# Patient Record
Sex: Male | Born: 1975 | Race: White | Hispanic: No | Smoking: Current every day smoker
Health system: Southern US, Academic
[De-identification: ages and names within clinical notes are randomized; demographics above are authoritative.]

## PROBLEM LIST (undated history)

## (undated) DIAGNOSIS — J45909 Unspecified asthma, uncomplicated: Secondary | ICD-10-CM

## (undated) DIAGNOSIS — E119 Type 2 diabetes mellitus without complications: Secondary | ICD-10-CM

## (undated) DIAGNOSIS — Z973 Presence of spectacles and contact lenses: Secondary | ICD-10-CM

## (undated) HISTORY — PX: HX HERNIA REPAIR: SHX51

## (undated) HISTORY — PX: KNEE ARTHROSCOPY: SUR90

## (undated) HISTORY — PX: VESICOSTOMY: SHX2661

## (undated) HISTORY — PX: HX EAR TUBES: 2100001170

## (undated) HISTORY — PX: HX ADENOIDECTOMY: SHX29

---

## 2016-03-05 ENCOUNTER — Other Ambulatory Visit (HOSPITAL_COMMUNITY): Payer: Self-pay | Admitting: Family

## 2021-02-17 IMAGING — MR MRI JOINT UPPER EXTREMITY WITHOUT CONTRAST LT
5 of 6 series · 29 of 40 positions shown · non-contrast
Comparison: None.

﻿EXAM:  MRI JOINT UPPER EXTREMITY WITHOUT CONTRAST LT SHOULDER
INDICATION: Left shoulder osteoarthritis.  Joint crepitus.   Evaluate labrum and rotator cuff integrity.
TECHNIQUE: Multiplanar, multisequence noncontrast MRI was performed.

[Series 6: T1 · oblique · left · 4.0mm · 0.31mm/px · 7 of 22 slices shown]
[im 1/22]
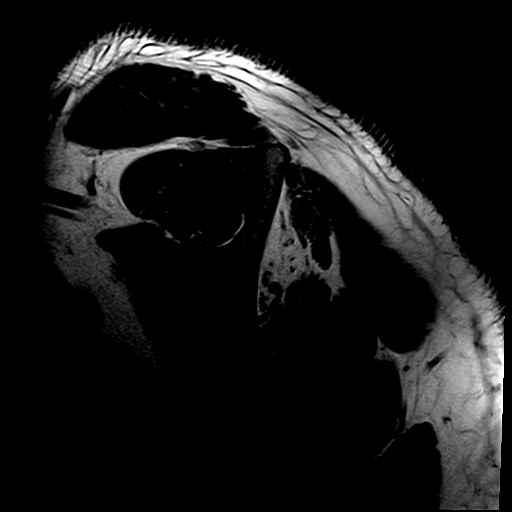
[im 4/22]
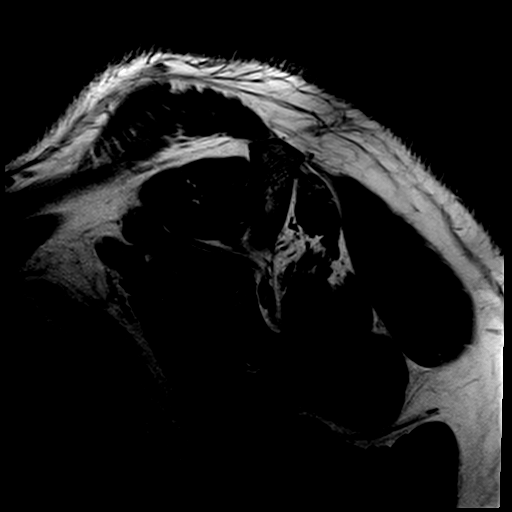
[im 8/22]
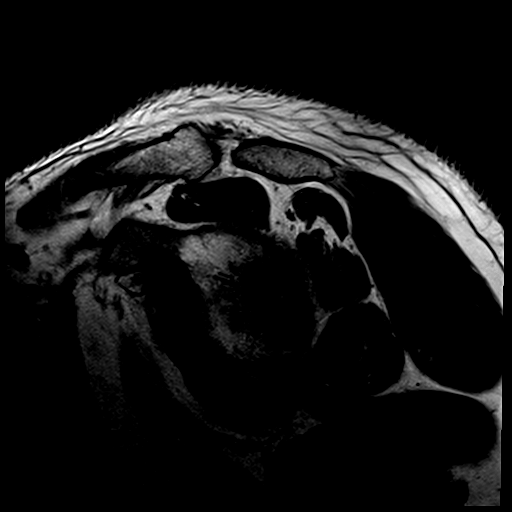
[im 11/22]
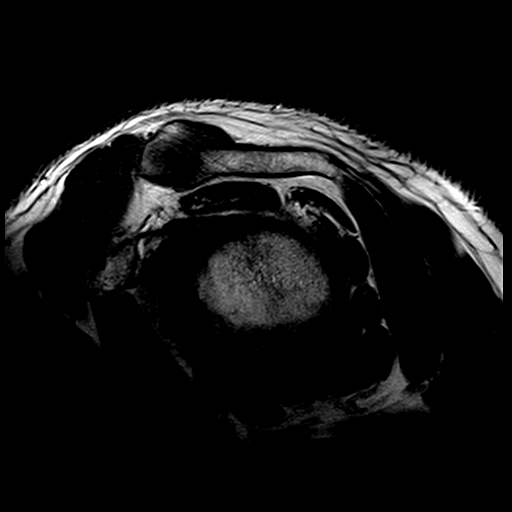
[im 15/22]
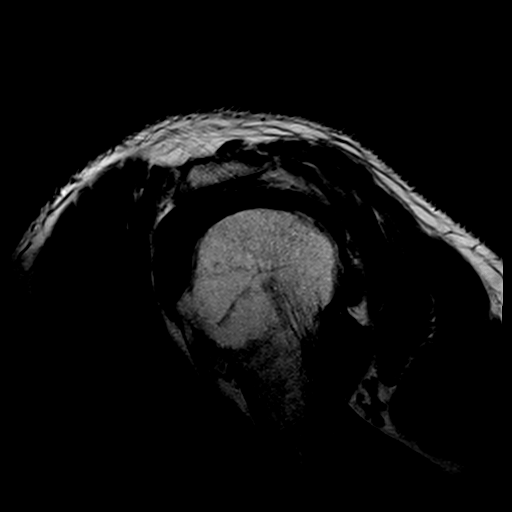
[im 18/22]
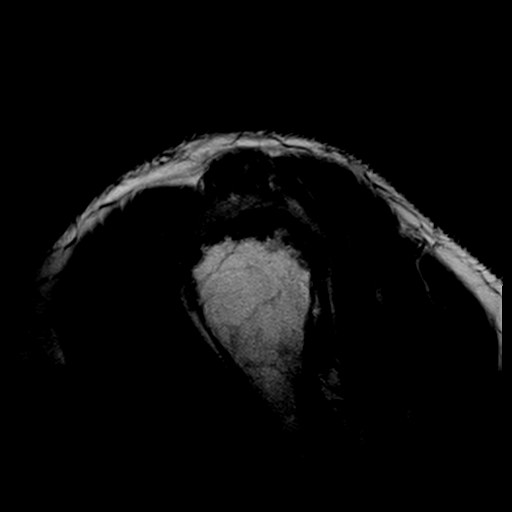
[im 22/22]
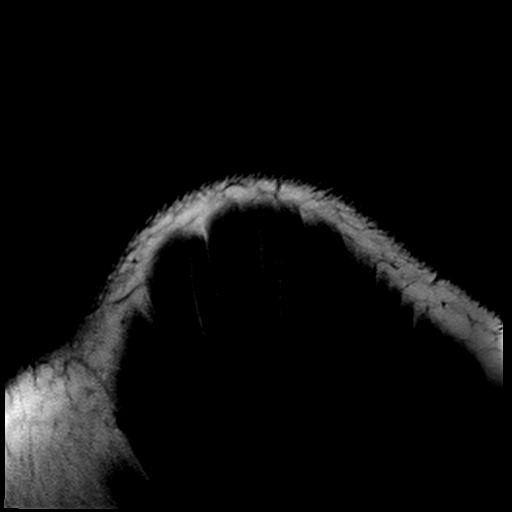

[Series 7: PD fat-sat · axial · left · 4.0mm · 0.36mm/px · z∈[-104,+6]mm · 7 of 24 slices shown (1 of 2)]
[im 1/24]
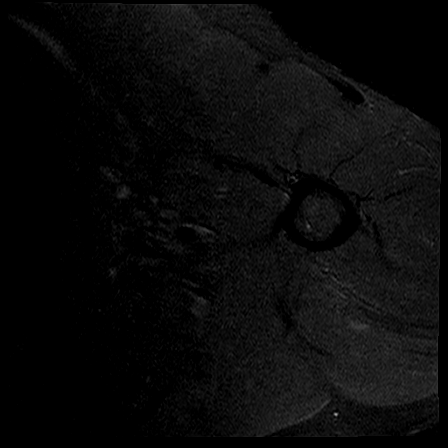
[im 4/24]
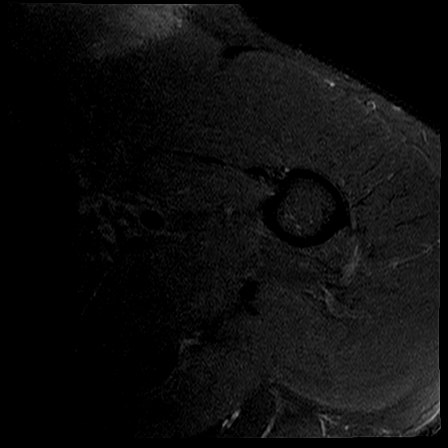
[im 8/24]
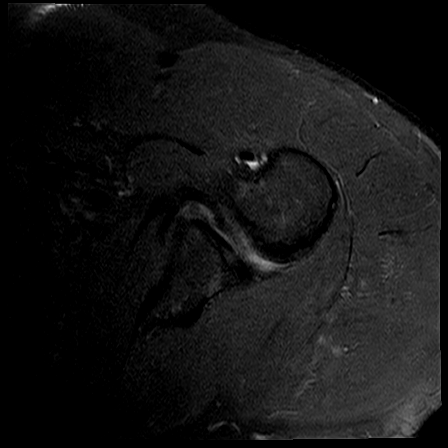
[im 12/24]
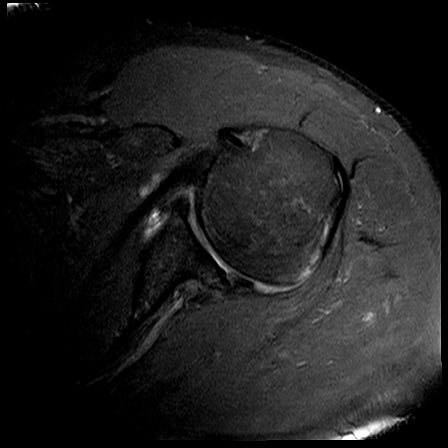
[im 16/24]
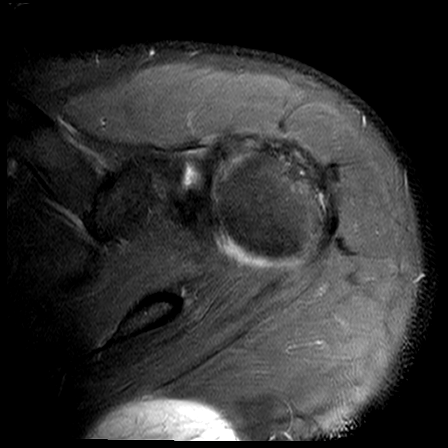
[im 20/24]
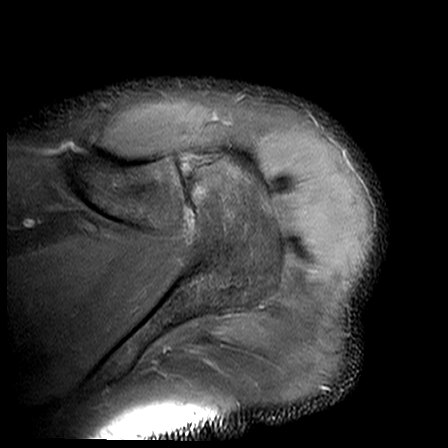
[im 24/24]
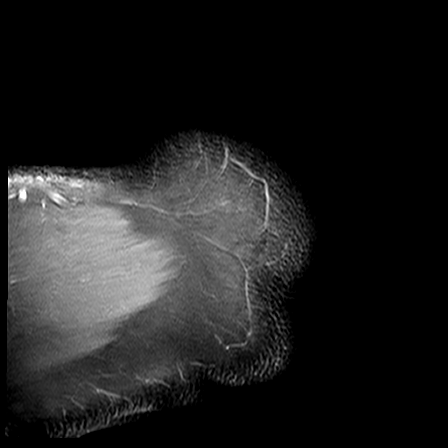

[Series 8: STIR · oblique · left · 4.0mm · 0.50mm/px · 2 of 22 slices shown]
[im 1/22]
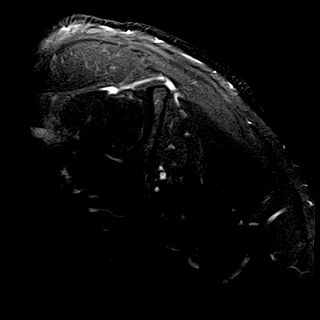
[im 4/22]
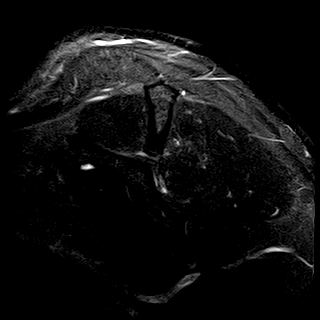

[Series 9: T2 fat-sat · axial · left · 4.0mm · 0.42mm/px · z∈[-104,+6]mm · 7 of 24 slices shown]
[im 1/24]
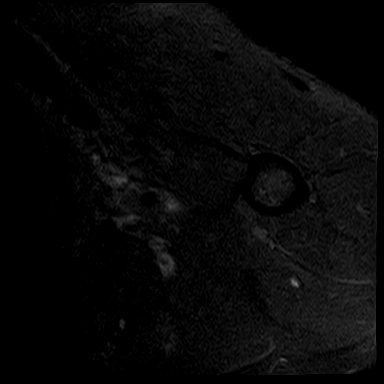
[im 4/24]
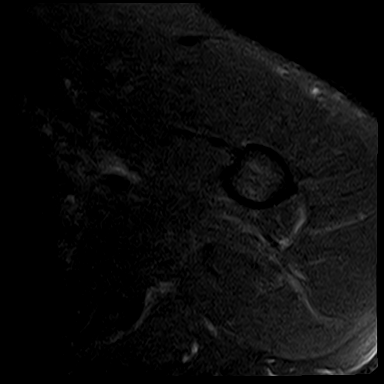
[im 8/24]
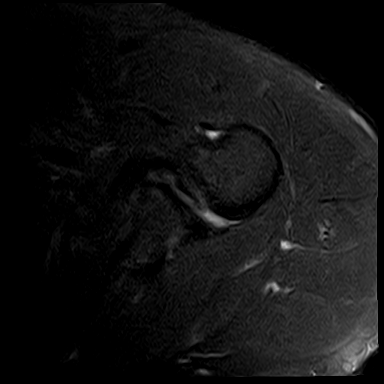
[im 12/24]
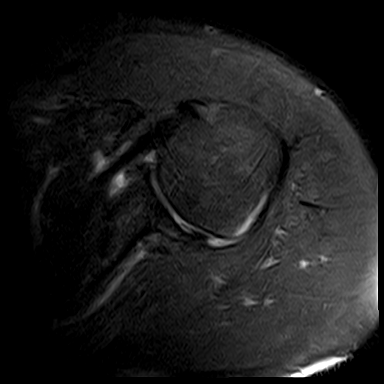
[im 16/24]
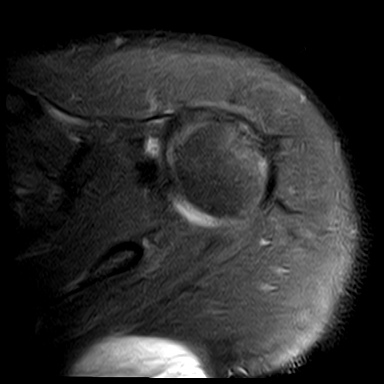
[im 20/24]
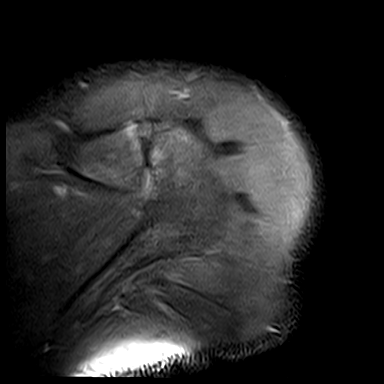
[im 24/24]
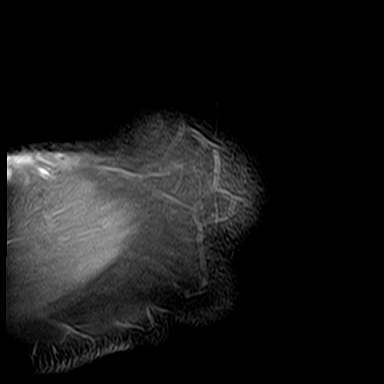

[Series 10: PD fat-sat · oblique · left · 4.0mm · 0.47mm/px · 6 of 21 slices shown (2 of 2)]
[im 1/21]
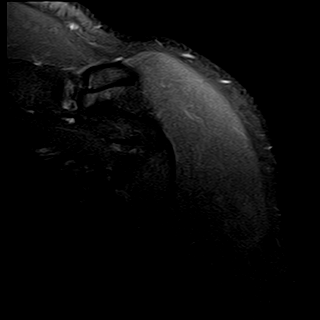
[im 5/21]
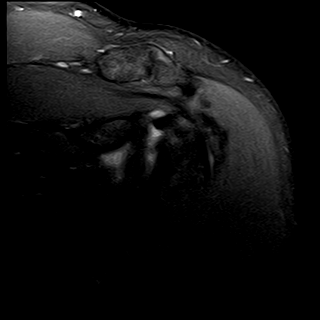
[im 9/21]
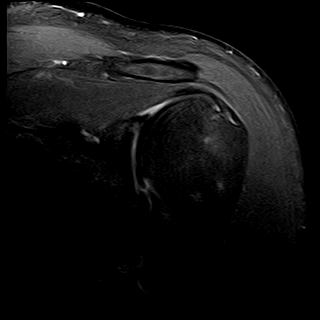
[im 13/21]
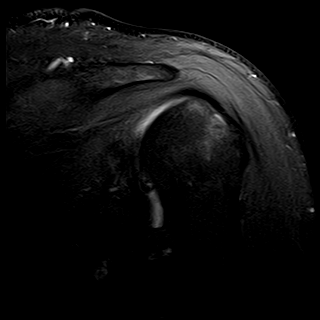
[im 17/21]
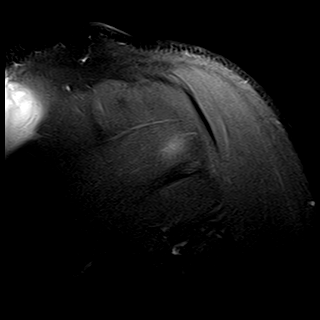
[im 21/21]
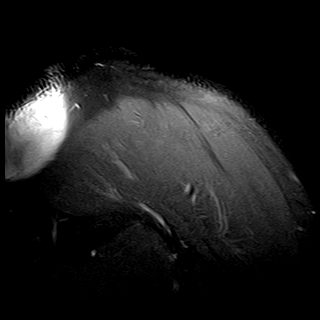

[29 of 40 positions shown; findings below may reference images not displayed]

FINDINGS: There is a small joint effusion.  

There is a small amount of intermediate signal intensity within the supraspinatus tendon compatible with mild tendinosis.  The visualized tendons appear otherwise intact.  No rotator cuff tear is seen.  

No labral tear is seen.  

There are moderate degenerative and hypertrophic changes involving the acromioclavicular joint with mild impingement on the distal supraspinatus muscle body.  The acromion is somewhat down sloping. 

There is no fracture or dislocation.  There is a small focus of degenerative marrow signal alteration adjacent to the insertion of the supraspinatus tendon.
IMPRESSION: 1. Moderate acromioclavicular osteoarthritis with mild impingement. 

2. Small joint effusion. 

3. No rotator cuff tear or labral tear is seen.

## 2022-09-06 ENCOUNTER — Emergency Department (HOSPITAL_BASED_OUTPATIENT_CLINIC_OR_DEPARTMENT_OTHER)

## 2022-09-06 ENCOUNTER — Emergency Department
Admission: EM | Admit: 2022-09-06 | Discharge: 2022-09-06 | Disposition: A | Discharge status: Home / Self Care | Attending: Family | Admitting: Family

## 2022-09-06 ENCOUNTER — Encounter (HOSPITAL_BASED_OUTPATIENT_CLINIC_OR_DEPARTMENT_OTHER): Payer: Self-pay

## 2022-09-06 ENCOUNTER — Other Ambulatory Visit: Payer: Self-pay

## 2022-09-06 DIAGNOSIS — X501XXA Overexertion from prolonged static or awkward postures, initial encounter: Secondary | ICD-10-CM | POA: Insufficient documentation

## 2022-09-06 DIAGNOSIS — X500XXA Overexertion from strenuous movement or load, initial encounter: Secondary | ICD-10-CM

## 2022-09-06 DIAGNOSIS — Y9389 Activity, other specified: Secondary | ICD-10-CM | POA: Insufficient documentation

## 2022-09-06 DIAGNOSIS — M25461 Effusion, right knee: Secondary | ICD-10-CM

## 2022-09-06 DIAGNOSIS — S83411A Sprain of medial collateral ligament of right knee, initial encounter: Secondary | ICD-10-CM | POA: Insufficient documentation

## 2022-09-06 HISTORY — DX: Unspecified asthma, uncomplicated: J45.909

## 2022-09-06 HISTORY — DX: Presence of spectacles and contact lenses: Z97.3

## 2022-09-06 HISTORY — DX: Type 2 diabetes mellitus without complications (CMS HCC): E11.9

## 2022-09-06 MED ORDER — KETOROLAC 60 MG/2 ML INTRAMUSCULAR SOLUTION
60.0000 mg | INTRAMUSCULAR | Status: AC
Start: 2022-09-06 — End: 2022-09-06
  Administered 2022-09-06: 60 mg via INTRAMUSCULAR

## 2022-09-06 MED ORDER — MELOXICAM 15 MG TABLET
15.0000 mg | ORAL_TABLET | Freq: Every day | ORAL | 0 refills | Status: AC
Start: 2022-09-06 — End: 2022-09-20

## 2022-09-06 MED ORDER — KETOROLAC 60 MG/2 ML INTRAMUSCULAR SOLUTION
INTRAMUSCULAR | Status: AC
Start: 2022-09-06 — End: 2022-09-06
  Filled 2022-09-06: qty 2

## 2022-09-06 NOTE — ED Nurses Note (Signed)
Ace wrap and immobilizer placed on right knee per ED provider order. Neurovascular intact. Patient tolerated well. Patient declines crutches, states he has a brand new set in his vehicle that work had given him this am. Education provided regarding at home care and use of crutches. Patient verbalized understanding and voices no further questions or concerns.

## 2022-09-06 NOTE — Discharge Instructions (Addendum)
May require mri and physical therapy. The orthopedic can determine this on follow up. Take brace off every 1 hour and bend knee to prevent blood clots. Ice elevate. Rest

## 2022-09-06 NOTE — ED Triage Notes (Signed)
Patient states happened about 0730 he was carrying a cart up the steps with another person and he was at the bottom when the other person adjusted his grip and dropped the cart and patient kept pushing and his right knee locked and rolled.

## 2022-09-06 NOTE — ED Nurses Note (Signed)
Patient discharged home with family.  AVS reviewed with patient/care giver.  A written copy of the AVS and discharge instructions was given to the patient/care giver. Script escribed to preferred pharmacy. Questions sufficiently answered as needed.  Patient/care giver encouraged to follow up with PCP as indicated.  In the event of an emergency, patient/care giver instructed to call 911 or go to the nearest emergency room.

## 2022-09-06 NOTE — ED Provider Notes (Signed)
[facility], [facility] Emergency Department  ED Primary Provider Note  History of Present Illness   Chief Complaint   Patient presents with    Knee Injury     Arrival: The patient arrived by Car    Jonathan Cherry is a 46 y.o. male who had concerns including Knee Injury. Pt states was carry heavy box up stairs with another person, rt knee gave out when wt of box shifted and then it twisted, instant swelling. Denies other pain.     Review of Systems   Constitutional: No fever, chills or weakness   Skin: No rash or diaphoresis  HENT: No headaches, or congestion  Eyes: No vision changes or photophobia   Cardio: No chest pain, palpitations or leg swelling   Respiratory: No cough, wheezing or SOB  GI:  No nausea, vomiting or stool changes  GU:  No dysuria, hematuria, or increased frequency  MSK: No muscle aches, +pain and injury joint no  back pain  Neuro: No seizures, LOC, numbness, tingling, or focal weakness  Psychiatric: No depression, SI or substance abuse  All other systems reviewed and are negative.    Historical Data   History Reviewed This Encounter: all noted and reviewed    Physical Exam   ED Triage Vitals [09/06/22 1108]   BP (Non-Invasive) (!) 141/100   Heart Rate 75   Respiratory Rate 16   Temperature 36.4 C (97.5 F)   SpO2 100 %   Weight 103 kg (228 lb)   Height 1.829 m (6')     \  Constitutional:  46 y.o. male who appears in no distress. Normal color, no cyanosis.   HENT:   Head: Normocephalic and atraumatic.   Mouth/Throat: Oropharynx is clear and moist.   Eyes: EOMI, PERRL   Neck: Trachea midline. Neck supple.  Cardiovascular: RRR, No murmurs, rubs or gallops. Intact distal pulses.  Pulmonary/Chest: BS equal bilaterally. No respiratory distress. No wheezes, rales or chest tenderness.   Abdominal: Bowel sounds present and normal. Abdomen soft, no tenderness, no rebound and no guarding.  Back: No midline spinal tenderness, no paraspinal tenderness, no CVA tenderness.            Musculoskeletal: + edema, tenderness  large effusion no redness no calf pain no  deformity. ROM intact w. Moderate pain.   Skin: warm and dry. No rash, erythema, pallor or cyanosis  Psychiatric: normal mood and affect. Behavior is normal.   Neurological: Patient keenly alert and responsive, easily able to raise eyebrows, facial muscles/expressions symmetric, speaking in fluent sentences, moving all extremities equally and fully, normal gait  Patient Data   Labs Ordered/Reviewed - No data to display  XR KNEE RIGHT 4 OR MORE VIEW   Final Result by Edi, Radresults In (09/14 1142)   KNEE JOINT EFFUSION.  WITH A HISTORY OF TRAUMA AND NO VISIBLE FRACTURE, INTERNAL DERANGEMENT CANNOT BE EXCLUDED.                Radiologist location ID: [id number]           Medical Decision Making   Diff dx of ligament injury , sprain strain. Effusion. Fx.          Medications Administered in the ED   ketorolac (TORADOL) 60mg /2 mL IM injection (has no administration in time range)     Clinical Impression   Sprain of medial collateral ligament of right knee, initial encounter (Primary)   Effusion of right knee - large  Disposition: Discharged

## 2022-10-30 IMAGING — MR MRI KNEE RT W/O CONTRAST
4 of 5 series · 25 of 40 positions shown · IV contrast (gadolinium)
Comparison: No prior imaging studies of right knee are available for comparison.

﻿EXAM:  [id number]   MRI KNEE RT W/O CONTRAST
INDICATION: 46-year-old male complaining of persistent right knee pain after trauma 2 months ago.  No history of knee surgery.
TECHNIQUE: Multiplanar, multisequential MRI of the right knee was performed without gadolinium contrast.

[Series 5: PD fat-sat · axial · right · 4.0mm · 0.53mm/px · z∈[-91,+40]mm · 8 of 30 slices shown (1 of 3)]
[im 1/30]
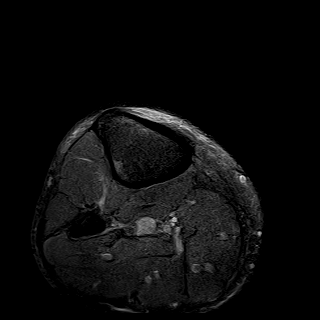
[im 5/30]
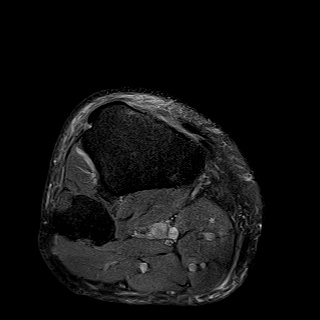
[im 9/30]
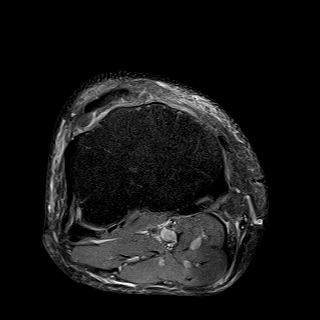
[im 13/30]
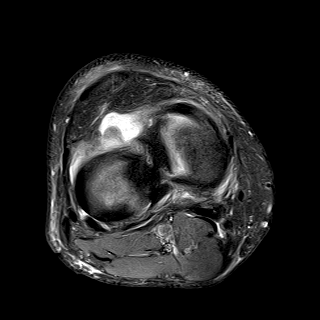
[im 17/30]
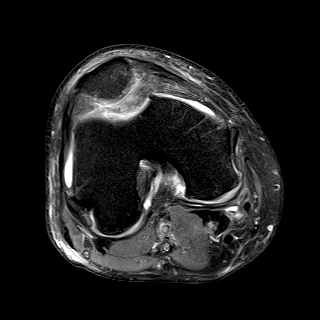
[im 21/30]
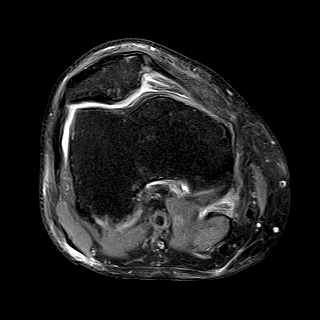
[im 25/30]
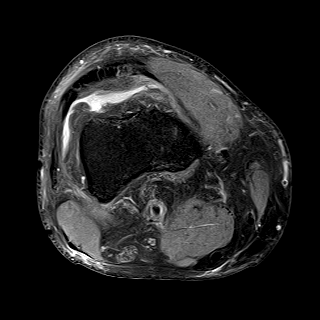
[im 30/30]
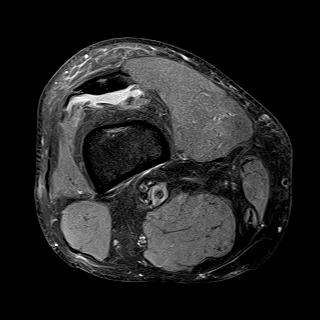

[Series 6: PD fat-sat · sagittal · right · 3.0mm · 0.31mm/px · 8 of 30 slices shown (2 of 3)]
[im 1/30]
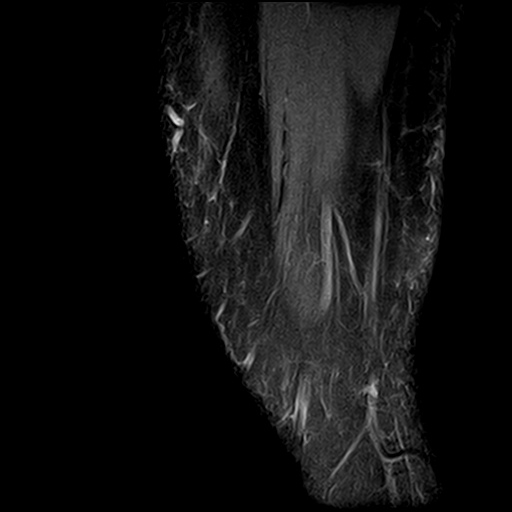
[im 5/30]
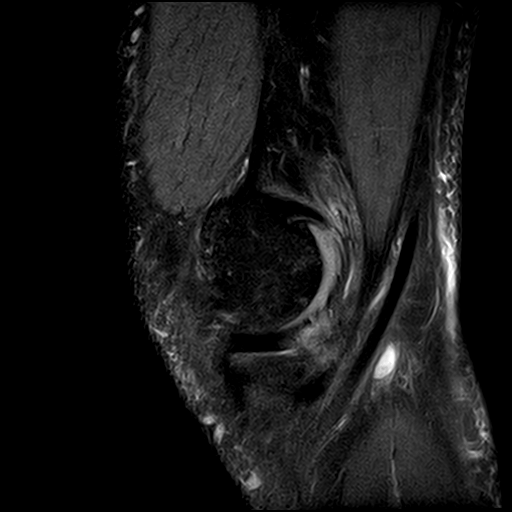
[im 9/30]
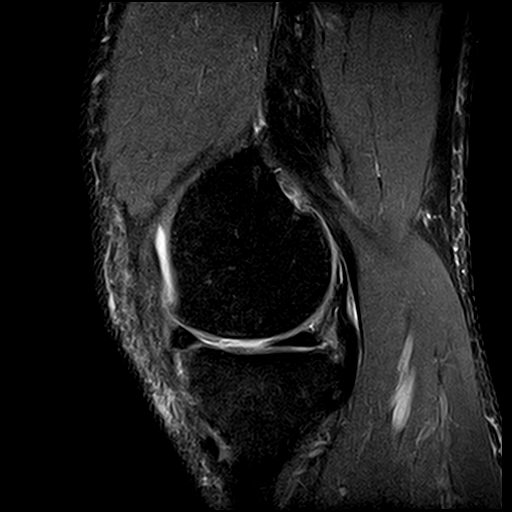
[im 13/30]
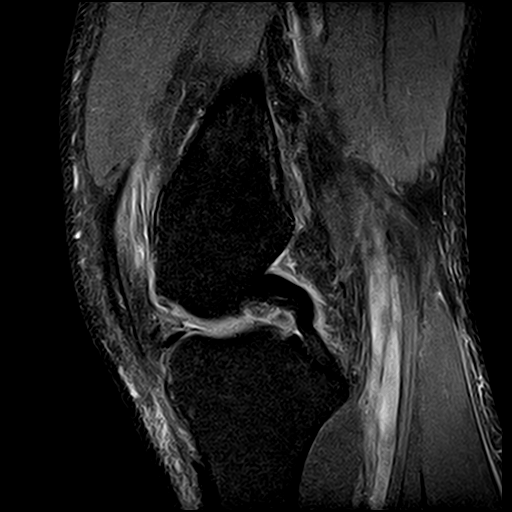
[im 17/30]
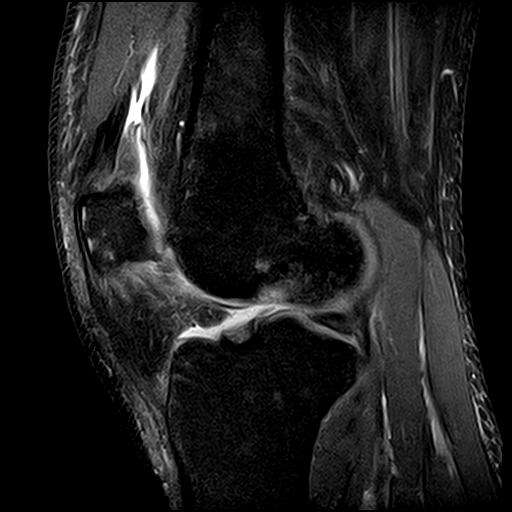
[im 21/30]
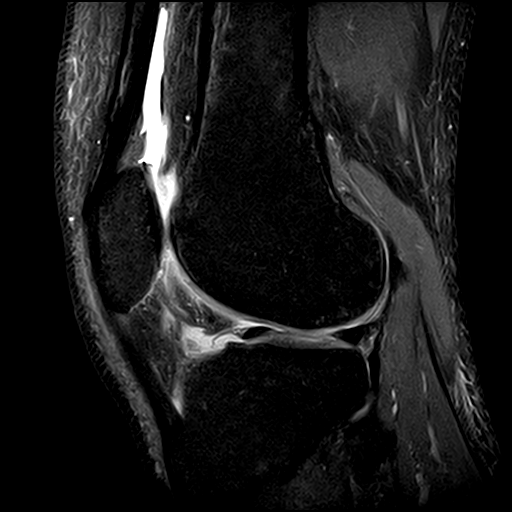
[im 25/30]
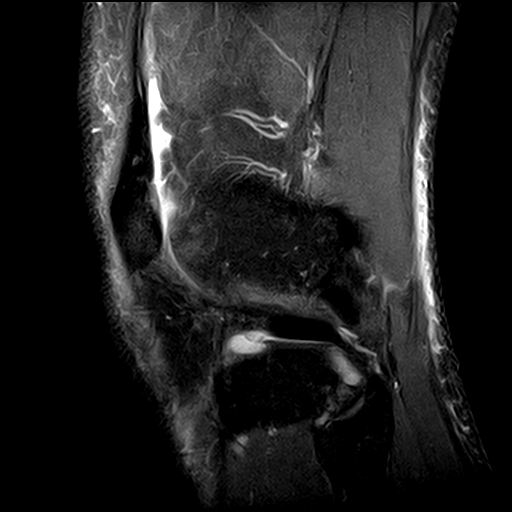
[im 30/30]
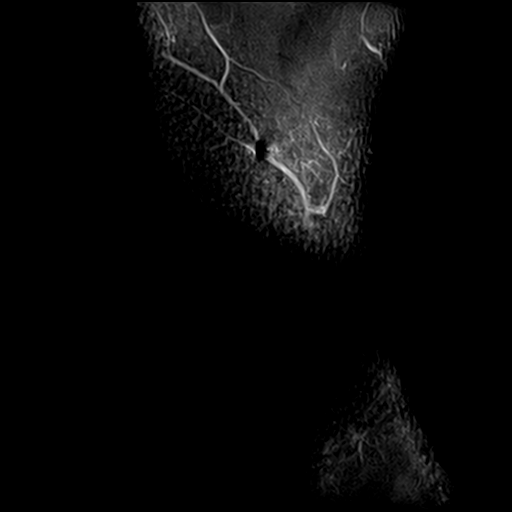

[Series 7: T1 · sagittal · right · 3.0mm · 0.31mm/px · 3 of 30 slices shown]
[im 5/30]
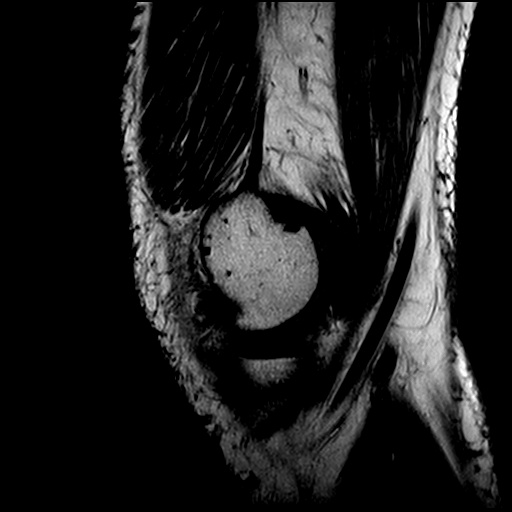
[im 17/30]
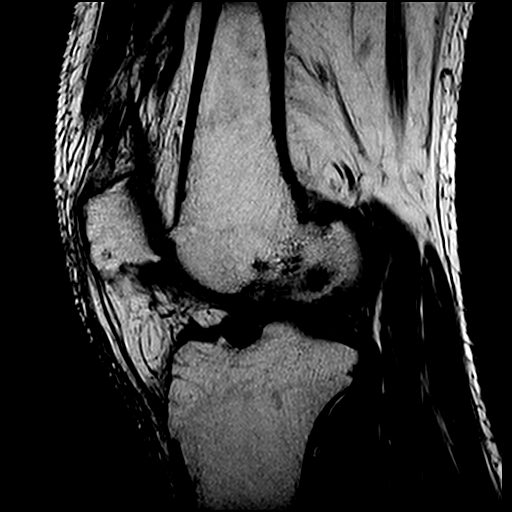
[im 25/30]
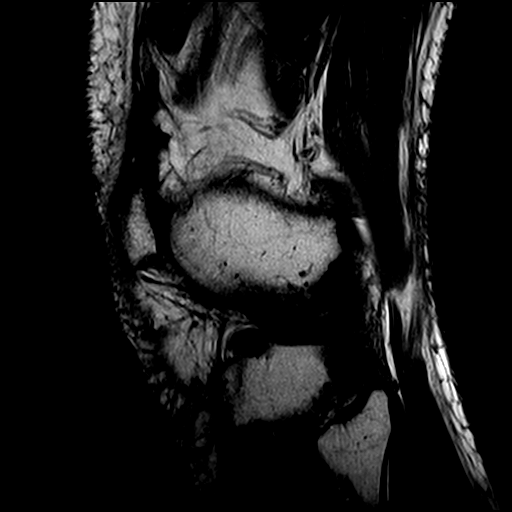

[Series 9: PD fat-sat · coronal · right · 3.0mm · 0.36mm/px · 6 of 27 slices shown (3 of 3)]
[im 1/27]
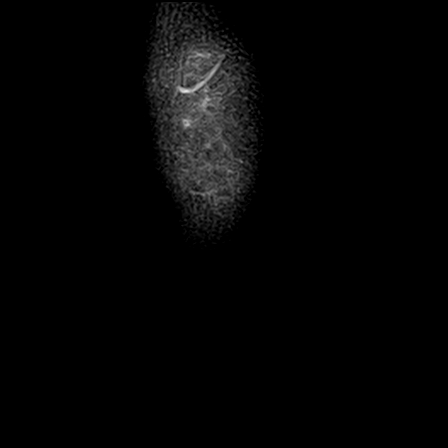
[im 4/27]
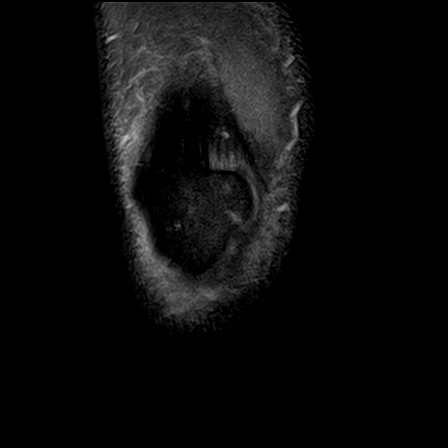
[im 8/27]
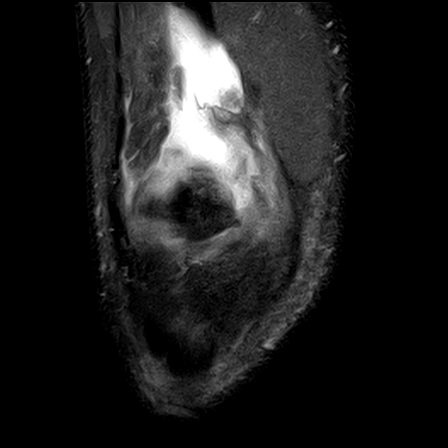
[im 12/27]
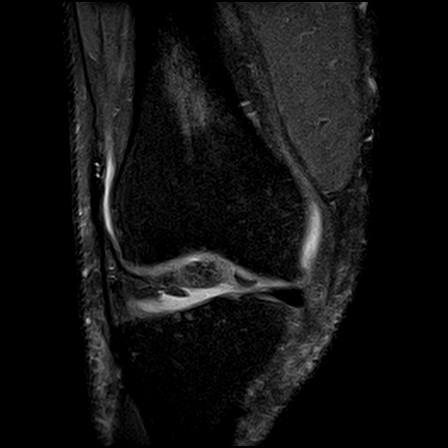
[im 15/27]
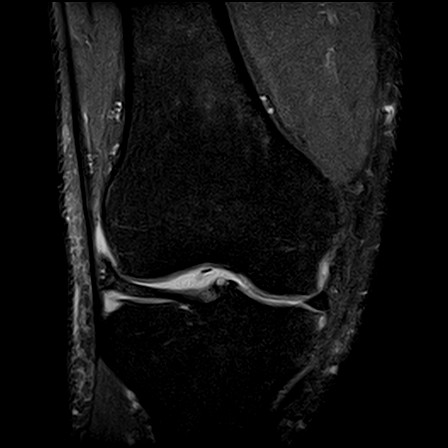
[im 23/27]
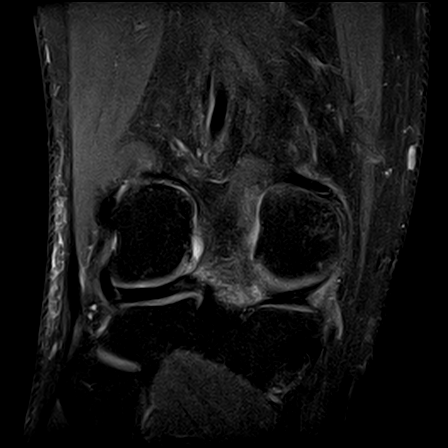

[25 of 40 positions shown; findings below may reference images not displayed]

FINDINGS: No acute bony lesions are seen at the right knee.

Lateral meniscus and lateral articular cartilage are normal.  The anterior and posterior cruciate ligaments are intact.

No acute abnormalities of the medial meniscus are seen.  Mild irregularity of mid medial meniscus with irregular internal edges suggests possible chronic injury. No displaced fragments are seen.

Significant, grade 3 to grade 4 degenerative changes of medial articular cartilage are noted.  Mild medial extrusion of mid medial meniscus is noted. 

No disruption of the medial and lateral collateral ligaments is seen.  Quadriceps tendon and patellar tendon are intact.  Grade 2 degenerative changes of patellofemoral articulation are noted.  Small effusion is noted in the knee joint.
IMPRESSION: 1. No acute bone changes at the right knee. 

2. Mild irregularity of mid medial meniscus with irregular internal edges suggests possible chronic injury. No displaced fragments are seen.  Mild medial extrusion of the mid medial meniscus is noted.

3. Grade 3 to grade 4 degenerative changes of medial articular cartilages.  Grade 2 degenerative changes of patellofemoral articular cartilages.

4. Effusion noted in the knee joint.

Electronically Signed by HARRUNA, APANDAGO at [id number] [DATE]

## 2023-10-31 NOTE — Unmapped External Note (Signed)
ORTHOPAEDIC OPERATIVE REPORT    ATTENDING SURGEON: Genia Hotter, MD    DATE OF SURGERY: 10/31/2023    PREOPERATIVE DIAGNOSIS: right knee arthrofibrosis    POSTOPERATIVE DIAGNOSIS: right knee arthrofibrosis    PROCEDURE PERFORMED: Right knee lysis of adhesions, manipulation under anesthesia    ASSISTANT: Pollie Meyer, MD     ESTIMATED BLOOD LOSS: Minimal.     COMPLICATIONS: None.     TOURNIQUET TIME: 20 minutes.     SPECIMENS: None.     DRAINS: None.     INDICATIONS FOR PROCEDURE: Jonathan Cherry is a 47 year male who has had ongoing right knee stiffness after ACL reconstruction.  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  The patient decided to opt for surgery after failing conservative management.    OPERATIVE REPORT: Jonathan Cherry came to the [facility] on 10/31/2023. he was seen in the preoperative holding area and the operative leg was marked, consents were reviewed, and any questions were answered for the patient. The patient was then taken back to the operating room, placed supine on the operating table with all bony prominences padded. An exam under anesthesia was performes which demonstrated ROM 0-115 degrees. Then a nonsterile tourniquet was placed around the upper thigh. The patient was induced under general anesthesia. Then the left lower extremity was prepped and draped in standard sterile fashion. A timeout was performed and preop antibiotics were given.  The leg was elevated and the tourniquet raised to 300 mmHg. The knee was then flexed and the inferolateral portal was created with an #11 blade scalpel. The camera was introduced, using the blunt trocar, into the suprapatellar pouch. 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 The camera was then taken down into the lateral gutter, where no loose bodies or plica  were found. No significant adhesions were visualized in the gutter. The camera was then brought into the medial gutter, where no loose bodies or adhesions were seen. The camera was then brought into the medial compartment and there was found to be degenerative wear of the posterior horn of the medial meniscus without discrete tear. An inferomedial portal was then localized with a spinal needle, and created using an #11 blade again.  The medial meniscus tear was probed and found to be stable. There was grade 3 chondromalacia on the medial femoral condyle with a small area of grade 4 which was debrided with a shaver. The camera was then turned to the notch, where the ACL was found to be intact. A small cyclops lesion was visualized and shaved down. Then the leg was brought into figure-of-four position. The camera was brought into the lateral compartment. There was no tear of the lateral meniscus.  The leg was brought back out to extension and the camera was brought into the patellofemoral joint.  Adhesions in the suprapatellar pouch were visualized and released on the medial and lateral sides. The knee was once more examined for loose bodies, of which none were found.  The knee was then evacuated of all fluids. Post operative exam demonstrated ROM 0-120. The portals were closed with 3-0 nylon interrupted sutures. 10mL of 0.25% Bupivicaine were infiltrated around each portal.  Xeroform sterile dressing was applied along with an Ace bandage, and the tourniquet was deflated after 20 minutes. The patient was awakened from anesthesia and taken to the  postoperative care unit in stable condition.     Dr. Genia Hotter, MD was present for the entire case.    Pollie Meyer, MD  Orthopedic Surgery, PGY-2  [facility] [phone]    I was present for the entire procedure.  I have edited the procedure note as appropriate.  Genia Hotter, MD

## 2023-11-01 NOTE — Consults (Signed)
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  Received new patient packet and has no questions.  Falls in last six months: No  Does patient report feeling safe in home: Yes In relationships: Yes    Allergies: No Known Allergies  Past medical history:   Past Medical History:   Diagnosis Date   . Diabetes (HCC)    . MRSA (methicillin resistant Staphylococcus aureus)      Past surgical history:   Past Surgical History:   Procedure Laterality Date   . HX HERNIA REPAIR     . HX HERNIA REPAIR INGUINAL Right    . HX TYMPANOSTOMY     . KNEE ARTHROSCOPY     . VASECTOMY       Medications:   Current Outpatient Medications on File Prior to Encounter   Medication Indication(s) Sig Dispense Refill   . lisinopril (PRINIVIL, ZESTRIL) 10 mg Tablet   take 10 mg by mouth every day     . atorvastatin (LIPITOR) 10 mg Tablet   take 10 mg by mouth every day     . diclofenac EC (VOLTAREN) 75 mg Tablet, Delayed Release (E.C.)   take 1 Tab by mouth two times daily as needed for Pain Take with food 10 Tab 0   . amoxicillin (AMOXIL) 500 mg Capsule   Reported on 04/04/2016     . clindamycin (CLEOCIN) 300 mg Capsule   Reported on 04/04/2016     . montelukast (SINGULAIR) 10 mg Tablet        . traMADol-acetaminophen (ULTRACET) 37.5-325 mg Tablet   Reported on 04/04/2016      . traMADol (ULTRAM) 50 mg Tablet   take 1 Tab by mouth every 6 hours as needed for Pain (Patient not taking: Reported on 04/04/2016) 20 Tab 0   . bacitracin 500 unit/gram Ointment   1 Application by Apply externally route three times daily (Patient not taking: Reported on 04/04/2016) 30 g 0     No current facility-administered medications on file prior to encounter.     Home environment/support system: lives with family  Occupation: Off work until MD release  Hand dominance: Right  Household/Job Requirements: pushing, pulling, lifting, carrying, squatting, kneeling, stooping, bending/twisting, lying on back, lying on side, overhead reaching, reaching to shoulder level, reaching behind back, gripping/pinching, keyboarding, sitting for long periods, standing for long periods, desk work, driving, housework  Does the patient have any barriers to learning: none  How does the patient prefer to learn new concepts: listening, reading, demonstration, pictures/video  Primary Language: English  Preferred Language for Healthcare Discussions: English  Prior therapy (same condition): Yes      SUBJECTIVE:   Patient presents today for physical therapy evaluation of s/p right knee ACL manipulation and debridement.     Reason  for Therapy: S/P right knee manipulation and debridement on 10/31/23. Follow up with surgeon on 11/11/23. Not had as much pain since surgery yesterday but was on a lot of medicine from the hospital. Taking meds regulalry now to control pain. WBAT.    PAIN ASSESSMENT _/10 NPRS         11/01/2023  0846             Pain Level: 7    Location: right knee    Pain at best: 3    Pain at worst: 9    Factors That Aggravate Pain: activity;movement;walking    Factors That Relieve Pain: activity;cold application;medication timing            Regular exercise frequency & type: weight training, 3-5 times per week    Prior level of function: Independent    Patient goals: "increase strength and maintian ROM, get back to  work"    OBJECTIVE:    Knee ROM: Measured in supine    Right Normal Range:  Degrees Initial Evaluation:  Degrees   Flexion  135 88   Extension  0 4   Left     Flexion 135 WNL   Extension 0 WNL     Left   /5 Strength Right  /5   4+ Hip Flexion 4   4+ Hip Abduction 4   4+ Knee Extension 3+   4 Knee Flexion 3+   4+ Dorsiflexion 4+   4+ Great toe extension 4+       Additional Assessments:  Functional mobility analysis: Patient arrives ambulating on bilateral axillary crutches with knee in extension brace with ace wrap underneath  Stands from chair with BUE assist    Bilateral lower extremity capillary refill and reflexes normal    Outcome Measures:  Lower Extremity Functional Scale (LEFS)   LEFS Score (Total possible 80)  Total possible score/How many questions answered: 20 questions answered/total possible score 80  Score (Total items answered/Total possible score then converted to %): 17.5    The lower the score, the greater the self-rated disability.    Treatment following evaluation included:   Therapeutic exercise: 30 minutes for patient education including objective findings, PT plan of care, rehab prognosis and goals as well as HEP instruction and demonstration listed below. 30 minutes  Direct 1:1 patient-therapist contact to ensure proper and effective technique with ther ex.                                                                                                                                                                                TREATMENT DATES  EXERCISE 11/01/2023       scifit L3 5' full  revolutions       Quad set 20 x 3"       Hamstring set 20 x 3"       Calf stretch 2 x 30"       Hamstring stretch 2 x 30"       Heel slide 15 x 3"       SAQ 2 x 10       SLR 2 x 10       LAQ 2 x 10       Calf raises 2 x 10       Climbers stretch 20 x 3"       Step up 2 x 10                               Initials DS       Diona Fanti (DS)    Access Code: [facility]  URL:  [email or web address]  Date: 11/04/2023  Prepared by: Diona Fanti    Exercises  - Supine Heel Slide with Strap  - 15 reps - 3 hold  - Supine Active Straight Leg Raise  - 2 sets - 10 reps  - Seated Long Arc Quad  - 2 sets - 10 reps  - Heel Raises with Counter Support  - 2 sets - 10 reps  - Standing Knee Flexion Stretch on Step  - 15 reps    ASSESSMENT: Christofher Eastman is a 47 y.o. male presenting with decreased strength, ROM and overall function of lower extremity that is typical following this procedure. He would benefit form skilled intervention to reduce pain, improve ROM, and strength so that he can return to previous work and recreational activities without limitations. He should respond well given overall health and motivation.      Patient will benefit from skilled physical therapy 2x/week for 8 weeks to address impairments listed.  Patient involved in and agreeable to the plan of care.       Pertinent Findings:                                                                                                                                                                                 Test / Measure  11/01/2023       LEFS 17.5%       Pain at worst 9/10       Right knee AROM 4-88       MedBridge Access Code [email or web address]               Initials CM           Problem List: Pain,  Impaired ROM, Impaired joint integrity/mobility, Functional strength deficit, Impaired mobility, Impaired balance, Gait disturbance/dysfunction, Limited ADLs/IADLs, Limited lower extremity function, Tenderness, and Impaired flexibility    Rehab Potential: Good to achieve stated goals    HEP/Patient Education provided today: Reviewed with patient the nature and mechanism of his current symptoms. Explained the general treatment approach that we would utilize to address each problem on his impairment list. Discussed the amount of time that it would take to reach his goals and the prognosis for his improvement. Began initial  HEP to address impairments as noted.    PLAN:  Focus on:  1)improve right knee ROM  2)improve right knee strength  3)improve strength and function in lunge, squat, stairs, gait    Precautions: WBAT  Interventions: ADLs/IADLs, Aquatic Therapy, Balance/Weight Bearing Training, Flexibility , Functional ROM/ROM Exercises, Gait Training, Home Exercise Program, Manual Therapy, Muscle pump exercises, Neuromuscular Re-education, Soft Tissue Mobilization/Joint Mobilization, Strengthening/PRE, Therapeutic Activities, Cryotherapy, Thermotherapy, and Electrical Stimulation  Frequency of Treatment: 2 times per week for 8 weeks.    PT Plan of Care Dated: 11/01/2023 to 12/30/23    Active       All/General       STG: Be independent with a initial HEP (home exercise program) to allow for continued and maintained functional gains following DC from this facility.       Start:  11/04/23    Expected End:  12/02/23            LTG: Demonstrate an increase in right lower extremity strength to >/= 4+/5 to improve functional mobility.       Start:  11/04/23    Expected End:  12/16/23               Functional       LTG: Demonstrate full eccentric control when sitting at a standard 16-inch chair       Start:  11/04/23    Expected End:  12/16/23            LTG: Demonstrate good LE alignment and full eccentric control to allow for reciprocal ascent and descent of stairs       Start:  11/04/23    Expected End:  12/30/23            LTG: Demonstrate ability to complete squat and lunge without c/o pain to improve functional mobility       Start:  11/04/23    Expected End:  12/30/23            LTG: Patient will report ability to complete recreation activity of standing/walking for 45 minutes without limitation from right knee pain to return to PLOF       Start:  11/04/23    Expected End:  12/30/23               Knee       LTG: Demonstrate right knee AROM flexion of 0-120 to prepare for gait on level surfaces       Start:  11/04/23    Expected End:   12/30/23                The above goals and plan of care were discussed with the patient. The Patient given an opportunity to ask questions.    Total Treatment Time: 45 Minutes  Total Timed Codes: 30 Minutes    Therapist: Alver Fisher, PT               Date: 11/01/2023  CSN: [id number]

## 2023-11-05 NOTE — Progress Notes (Signed)
[facility]  [address] 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  Medication changes since last visit: No  Falls since last visit: No      Pertinent Findings:                                                                                                                                                                                 Test / Measure  11/01/2023  11/04/2023         LEFS 17.5%           Pain at worst 9/10  5/10         Right knee AROM 4-88           [employer] Access Code [email or web address]                         Initials CM  DS               Subjective:  Patient reports he is still having knee pain especially in his quad but he completed all weekend   PAIN ASSESSMENT _/10 NPRS         11/04/2023  0230             Pain Level: 5    Location: right knee    Pain at best: 3    Pain at worst: 9             Objective:     Treatment following evaluation included:   Therapeutic exercise: 45 minutes of skilled intervention required today for progression of right knee AROM and strength exercises.     Vasopneumatic compression and CP to right knee for edema and pain control 10'  TREATMENT DATES  EXERCISE 11/01/2023  11/04/2023          scifit L3 5' full revolutions L4 5' nustep           Quad set 20 x 3" 20 x 3"          Hamstring set 20 x 3"  20 x 3"         Calf stretch 2 x 30"  2 x 30"         Hamstring stretch 2 x 30"  2 x 30"         Heel slide 15 x 3"  15 x 3" overpressure         SAQ 2 x 10  2 x 10         SLR 2 x 10  2 x 10         LAQ 2 x 10  2 x 10         Calf raises 2 x 10  2 x 10         Climbers stretch 20 x 3"  20 x 3"         Step up 2 x 10  2 x 10 4"                                                   Initials DS  DS         Dustin Spivey (DS)    Active       All/General       STG: Be independent with a initial HEP (home exercise program) to allow for continued and maintained functional gains following DC from this facility. (Progressing)       Start:  11/04/23    Expected End:  12/02/23            LTG: Demonstrate an increase in right lower extremity strength to >/= 4+/5 to improve functional mobility. (Progressing)       Start:  11/04/23    Expected End:  12/16/23               Functional       LTG: Demonstrate full eccentric control when sitting at a standard 16-inch chair (Progressing)       Start:  11/04/23    Expected End:  12/16/23            LTG: Demonstrate good LE alignment and full eccentric control to allow for reciprocal ascent and descent of stairs (Progressing)       Start:  11/04/23    Expected End:  12/30/23            LTG: Demonstrate ability to complete squat and lunge without c/o pain to improve functional mobility (Progressing)       Start:  11/04/23    Expected End:  12/30/23            LTG: Patient will report ability to complete recreation activity of standing/walking for 45 minutes without limitation from right knee pain to return to PLOF (Progressing)       Start:  11/04/23    Expected End:  12/30/23               Knee       LTG: Demonstrate right knee AROM flexion of 0-120 to prepare  for gait on level surfaces (Progressing)       Start:  11/04/23    Expected End:  12/30/23                    Assessment:  Patient reports pain level of 3/10 NPRS following treatment. Patient compliance with therapy is good with consistent follow through.  Patient requires continued skilled intervention for improving right knee AROM and strength.    Patient displays good progress. Progress toward goals as evidenced by completin all exercises today with improved right knee AROM.  The Patient demonstrated and verbalized understanding of exercises discussed today.    Plan:   Precautions: WBAT  Continue with current plan of care with focus on improving right knee AROM and strength.         HEP/Patient Education provided today: continue HEP as directed in initial evaluation         Timed code treatment: 45 minutes  Total treatment time: 55 minutes KX modifier required      Yes     X No  POC Expires: 12/30/23    Therapist: Alver Fisher, PT             Date: 11/04/2023    CSN: [id number]

## 2023-11-07 ENCOUNTER — Emergency Department
Admission: EM | Admit: 2023-11-07 | Discharge: 2023-11-07 | Disposition: A | Discharge status: Home / Self Care | Attending: Physician Assistant | Admitting: Physician Assistant

## 2023-11-07 ENCOUNTER — Inpatient Hospital Stay (HOSPITAL_COMMUNITY)

## 2023-11-07 ENCOUNTER — Encounter (HOSPITAL_COMMUNITY): Payer: Self-pay | Admitting: Physician Assistant

## 2023-11-07 ENCOUNTER — Other Ambulatory Visit: Payer: Self-pay

## 2023-11-07 DIAGNOSIS — Z9889 Other specified postprocedural states: Secondary | ICD-10-CM

## 2023-11-07 DIAGNOSIS — M25461 Effusion, right knee: Secondary | ICD-10-CM | POA: Insufficient documentation

## 2023-11-07 DIAGNOSIS — G8918 Other acute postprocedural pain: Secondary | ICD-10-CM | POA: Insufficient documentation

## 2023-11-07 DIAGNOSIS — M25469 Effusion, unspecified knee: Secondary | ICD-10-CM

## 2023-11-07 DIAGNOSIS — R609 Edema, unspecified: Secondary | ICD-10-CM

## 2023-11-07 LAB — CBC WITH DIFF
BASOPHIL #: 0.1 10*3/uL (ref 0.00–0.10)
BASOPHIL %: 1 % (ref 0–1)
EOSINOPHIL #: 0.3 10*3/uL (ref 0.00–0.50)
EOSINOPHIL %: 4 % (ref 1–8)
HCT: 43.5 % (ref 36.7–47.1)
HGB: 15 g/dL (ref 12.5–16.3)
LYMPHOCYTE #: 2.7 10*3/uL (ref 1.00–3.00)
LYMPHOCYTE %: 32 % (ref 16–44)
MCH: 32.3 pg (ref 23.8–33.4)
MCHC: 34.4 g/dL (ref 32.5–36.3)
MCV: 93.9 fL (ref 73.0–96.2)
MONOCYTE #: 0.9 10*3/uL (ref 0.30–1.00)
MONOCYTE %: 11 % (ref 5–13)
MPV: 8.5 fL (ref 7.4–11.4)
NEUTROPHIL #: 4.6 10*3/uL (ref 1.85–7.80)
NEUTROPHIL %: 53 % (ref 43–77)
PLATELETS: 226 10*3/uL (ref 140–440)
RBC: 4.63 10*6/uL (ref 4.06–5.63)
RDW: 13.9 % (ref 12.1–16.2)
WBC: 8.7 10*3/uL (ref 3.6–10.2)

## 2023-11-07 LAB — PTT (PARTIAL THROMBOPLASTIN TIME): APTT: 33.4 s (ref 25.0–38.0)

## 2023-11-07 LAB — PT/INR
INR: 1.02 (ref 0.84–1.10)
PROTHROMBIN TIME: 11.9 s (ref 9.8–12.7)

## 2023-11-07 LAB — COMPREHENSIVE METABOLIC PANEL, NON-FASTING
ALBUMIN/GLOBULIN RATIO: 1.4 (ref 0.8–1.4)
ALBUMIN: 4.6 g/dL (ref 3.5–5.7)
ALKALINE PHOSPHATASE: 58 U/L (ref 34–104)
ALT (SGPT): 12 U/L (ref 7–52)
ANION GAP: 9 mmol/L (ref 4–13)
AST (SGOT): 14 U/L (ref 13–39)
BILIRUBIN TOTAL: 1 mg/dL (ref 0.3–1.0)
BUN/CREA RATIO: 17 (ref 6–22)
BUN: 15 mg/dL (ref 7–25)
CALCIUM, CORRECTED: 9.6 mg/dL (ref 8.9–10.8)
CALCIUM: 10.1 mg/dL (ref 8.6–10.3)
CHLORIDE: 105 mmol/L (ref 98–107)
CO2 TOTAL: 27 mmol/L (ref 21–31)
CREATININE: 0.88 mg/dL (ref 0.60–1.30)
ESTIMATED GFR: 105 mL/min/{1.73_m2} (ref >59)
GLOBULIN: 3.2 (ref 2.0–3.5)
GLUCOSE: 89 mg/dL (ref 74–109)
OSMOLALITY, CALCULATED: 282 mosm/kg (ref 270–290)
POTASSIUM: 4.1 mmol/L (ref 3.5–5.1)
PROTEIN TOTAL: 7.8 g/dL (ref 6.4–8.9)
SODIUM: 141 mmol/L (ref 136–145)

## 2023-11-07 LAB — C-REACTIVE PROTEIN (CRP): C-REACTIVE PROTEIN (CRP): <0.5 mg/dL (ref 0.1–0.5)

## 2023-11-07 LAB — SEDIMENTATION RATE: ERYTHROCYTE SEDIMENTATION RATE (ESR): 17 mm/h — ABNORMAL HIGH (ref <15)

## 2023-11-07 NOTE — ED APP Handoff Note (Signed)
[facility] - Emergency Department  Emergency Department  Provider in Triage Note    Name: Jonathan Cherry  Age: 47 y.o.  Gender: male     Subjective:   Jonathan Cherry is a 47 y.o. male who presents with complaint of Leg Pain  .  Pt c/o increased pain and swelling in RLE after knee surgery.   Objective:   There were no vitals filed for this visit.   Focused Physical Exam shows swelling, erythema.   Plan:  Please see initial orders and work-up below.  This is to be continued with full evaluation in the main Emergency Department.     No current facility-administered medications for this encounter.     Results for orders placed or performed during the hospital encounter of 11/07/23 (from the past 24 hour(s))   CBC/DIFF    Narrative    The following orders were created for panel order CBC/DIFF.  Procedure                               Abnormality         Status                     ---------                               -----------         ------                     CBC WITH AVWU[[id number]]                                                                 Please view results for these tests on the individual orders.

## 2023-11-07 NOTE — Discharge Instructions (Signed)
Follow up with the orthopedic surgeon as planned you can apply ice keep the extremity elevated.  If new or worsening symptoms return to the emergency department

## 2023-11-07 NOTE — ED Provider Notes (Signed)
[facility] - Emergency Department  ED Primary Provider Note  History of Present Illness   Chief Complaint   Patient presents with    Leg Pain    Post-Op Problem     Jonathan Cherry is a 47 y.o. male who had concerns including Leg Pain and Post-Op Problem.  Arrival: The patient arrived by Car    Patient is a 47 year old male who is 1 week status post arthroscopic surgery on the right knee presents emergency department with complaints of swelling and pain.  States that his orthopedic surgeon at [facility] wanted an ultrasound to rule out deep vein thrombosis.  States it is knee has been swollen it is better than yesterday.  He states he has been on antibiotics for bronchitis.  He has concern for infection.  Patient is a diabetic however this is well controlled with diet last A1c was 5.6      History Reviewed This Encounter: Medical History  Surgical History  Family History  Social History    Physical Exam   ED Triage Vitals [11/07/23 1944]   BP (Non-Invasive) (!) 168/94   Heart Rate 84   Respiratory Rate 18   Temperature (!) 35.8 C (96.5 F)   SpO2 100 %   Weight 96.6 kg (213 lb)   Height      Physical Exam  Constitutional:       Appearance: Normal appearance.   HENT:      Head: Normocephalic.      Mouth/Throat:      Mouth: Mucous membranes are moist.   Eyes:      Extraocular Movements: Extraocular movements intact.      Pupils: Pupils are equal, round, and reactive to light.   Cardiovascular:      Rate and Rhythm: Normal rate and regular rhythm.      Pulses: Normal pulses.      Heart sounds: Normal heart sounds.   Pulmonary:      Effort: Pulmonary effort is normal.      Breath sounds: Normal breath sounds.   Abdominal:      General: Abdomen is flat.      Palpations: Abdomen is soft.   Musculoskeletal:      Cervical back: Normal range of motion.      Comments: Mild effusion of the right knee no significant redness warmth or discharge   Neurological:      Mental Status: He is alert.       Patient Data      Labs Ordered/Reviewed   SEDIMENTATION RATE - Abnormal; Notable for the following components:       Result Value    ERYTHROCYTE SEDIMENTATION RATE (ESR) 17 (*)     All other components within normal limits    Narrative:     For ED adult patients suspected of sepsis, MDW<=20.0 does not rule out sepsis or risk of sepsis  [id number] ON [id number] IN 0000/4   COMPREHENSIVE METABOLIC PANEL, NON-FASTING - Normal    Narrative:     Estimated Glomerular Filtration Rate (eGFR) is calculated using the CKD-EPI (2021) equation, intended for patients 9 years of age and older. If gender is not documented or "unknown", there will be no eGFR calculation.     PT/INR - Normal    Narrative:     In the setting of warfarin therapy, a moderate-intensity INR goal range is 2.0 to 3.0 and a high-intensity INR goal range is 2.5 to 3.5.    INR is ONLY validated  to determine the level of anticoagulation with vitamin K antagonists (warfarin). Other factors may elevate the INR including but not limited to direct oral anticoagulants (DOACs), liver dysfunction, vitamin K deficiency, DIC, factor deficiencies, and factor inhibitors.   PTT (PARTIAL THROMBOPLASTIN TIME) - Normal   CBC WITH DIFF - Normal   C-REACTIVE PROTEIN (CRP) - Normal   CBC/DIFF    Narrative:     The following orders were created for panel order CBC/DIFF.  Procedure                               Abnormality         Status                     ---------                               -----------         ------                     CBC WITH GMWN[[id number]]                Normal              Final result                 Please view results for these tests on the individual orders.     No orders to display     Medical Decision Making        Medical Decision Making  Venous duplex was negative for deep vein thrombosis blood work largely unremarkable even inflammatory markers are within normal limits were on the low end of high.  Suspect this is likely postoperative swelling.  Patient  currently on antibiotics.  He is follow up on Monday.  Discharged we discussed return precautions                   Clinical Impression   Swelling   Knee effusion (Primary)       Disposition: Discharged

## 2023-11-07 NOTE — ED Triage Notes (Signed)
Right knee surgery one week ago, received order for ultrasound but was unable to schedule it today before [facility] dept closed, reports increased pain and swelling to right knee and leg
